# Patient Record
Sex: Female | Born: 1965 | Race: White | Hispanic: No | Marital: Single | Smoking: Never smoker
Health system: Southern US, Community
[De-identification: ages and names within clinical notes are randomized; demographics above are authoritative.]

## PROBLEM LIST (undated history)

## (undated) DIAGNOSIS — F329 Major depressive disorder, single episode, unspecified: Secondary | ICD-10-CM

## (undated) DIAGNOSIS — F32A Depression, unspecified: Secondary | ICD-10-CM

## (undated) DIAGNOSIS — K219 Gastro-esophageal reflux disease without esophagitis: Secondary | ICD-10-CM

## (undated) DIAGNOSIS — R7302 Impaired glucose tolerance (oral): Secondary | ICD-10-CM

## (undated) DIAGNOSIS — I10 Essential (primary) hypertension: Secondary | ICD-10-CM

## (undated) DIAGNOSIS — N189 Chronic kidney disease, unspecified: Secondary | ICD-10-CM

## (undated) HISTORY — DX: Chronic kidney disease, unspecified: N18.9

## (undated) HISTORY — DX: Essential (primary) hypertension: I10

## (undated) HISTORY — DX: Impaired glucose tolerance (oral): R73.02

## (undated) HISTORY — DX: Morbid (severe) obesity due to excess calories: E66.01

## (undated) HISTORY — DX: Gastro-esophageal reflux disease without esophagitis: K21.9

## (undated) HISTORY — DX: Depression, unspecified: F32.A

## (undated) HISTORY — DX: Major depressive disorder, single episode, unspecified: F32.9

---

## 1998-11-26 ENCOUNTER — Encounter: Payer: Self-pay | Admitting: Neurosurgery

## 1998-11-26 ENCOUNTER — Ambulatory Visit (HOSPITAL_COMMUNITY): Admission: RE | Admit: 1998-11-26 | Discharge: 1998-11-26 | Payer: Self-pay | Admitting: Neurosurgery

## 2000-07-11 ENCOUNTER — Other Ambulatory Visit: Admission: RE | Admit: 2000-07-11 | Discharge: 2000-07-11 | Payer: Self-pay | Admitting: Family Medicine

## 2003-01-10 ENCOUNTER — Other Ambulatory Visit: Admission: RE | Admit: 2003-01-10 | Discharge: 2003-01-10 | Payer: Self-pay | Admitting: Family Medicine

## 2005-07-25 ENCOUNTER — Ambulatory Visit: Payer: Self-pay | Admitting: Family Medicine

## 2005-09-24 ENCOUNTER — Ambulatory Visit: Payer: Self-pay | Admitting: Family Medicine

## 2006-12-09 HISTORY — PX: CHOLECYSTECTOMY: SHX55

## 2009-04-28 ENCOUNTER — Emergency Department (HOSPITAL_COMMUNITY): Admission: EM | Admit: 2009-04-28 | Discharge: 2009-04-28 | Payer: Self-pay | Admitting: Emergency Medicine

## 2009-12-09 HISTORY — PX: ABDOMINAL HYSTERECTOMY: SHX81

## 2010-06-05 ENCOUNTER — Other Ambulatory Visit: Admission: RE | Admit: 2010-06-05 | Discharge: 2010-06-05 | Payer: Self-pay | Admitting: Internal Medicine

## 2010-06-21 ENCOUNTER — Ambulatory Visit (HOSPITAL_COMMUNITY): Admission: RE | Admit: 2010-06-21 | Discharge: 2010-06-21 | Payer: Self-pay | Admitting: Internal Medicine

## 2010-08-01 ENCOUNTER — Ambulatory Visit: Payer: Self-pay | Admitting: Gynecology

## 2010-08-01 ENCOUNTER — Inpatient Hospital Stay (HOSPITAL_COMMUNITY): Admission: AD | Admit: 2010-08-01 | Discharge: 2010-08-01 | Payer: Self-pay | Admitting: Obstetrics & Gynecology

## 2010-08-30 ENCOUNTER — Other Ambulatory Visit: Admission: RE | Admit: 2010-08-30 | Discharge: 2010-08-30 | Payer: Self-pay | Admitting: Obstetrics & Gynecology

## 2010-08-30 ENCOUNTER — Ambulatory Visit: Payer: Self-pay | Admitting: Obstetrics & Gynecology

## 2010-09-13 ENCOUNTER — Ambulatory Visit: Payer: Self-pay | Admitting: Obstetrics and Gynecology

## 2010-09-25 ENCOUNTER — Ambulatory Visit (HOSPITAL_COMMUNITY): Admission: RE | Admit: 2010-09-25 | Discharge: 2010-09-25 | Payer: Self-pay | Admitting: Family Medicine

## 2010-09-27 ENCOUNTER — Ambulatory Visit: Payer: Self-pay | Admitting: Obstetrics & Gynecology

## 2010-09-28 ENCOUNTER — Ambulatory Visit: Payer: Self-pay | Admitting: Obstetrics & Gynecology

## 2010-10-31 IMAGING — US US TRANSVAGINAL NON-OB
1 series · 13 of 25 positions shown · non-contrast
Comparison: CT on 06/21/2010

CLINICAL DATA: Pelvic pain.  Abnormal uterine bleeding.  History of
uterine tumor.



[Series 1: us transvaginal non-ob · 0.19mm/px · 13 of 81 slices shown]
[im 1/81]
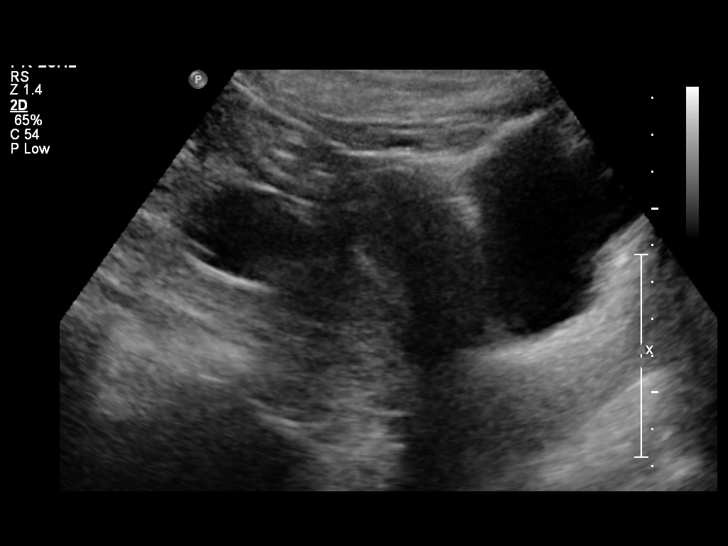
[im 7/81]
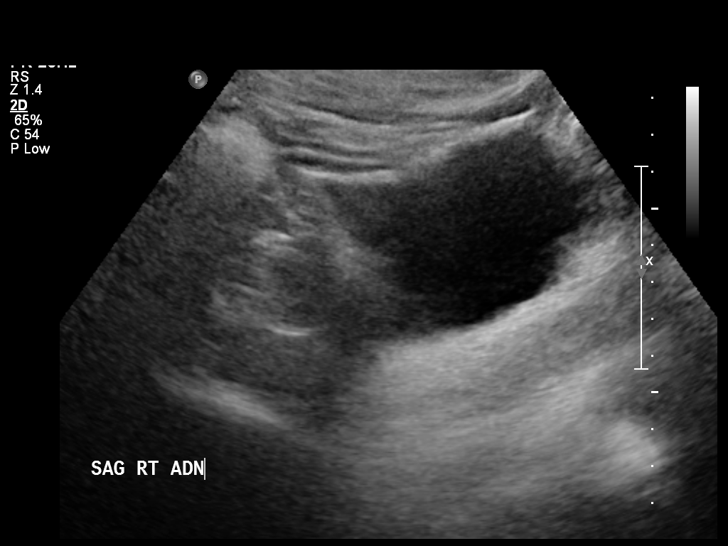
[im 14/81]
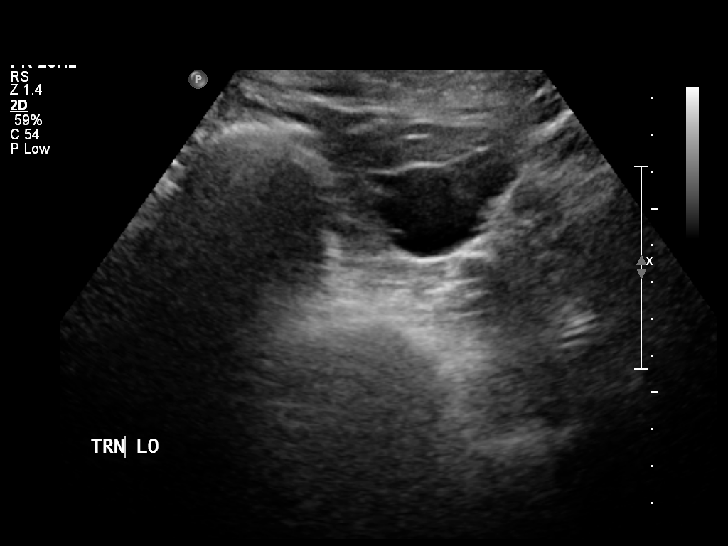
[im 21/81]
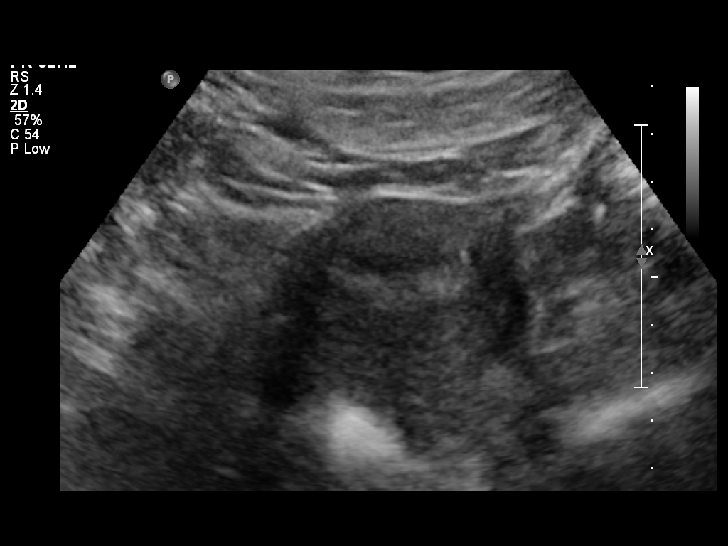
[im 27/81]
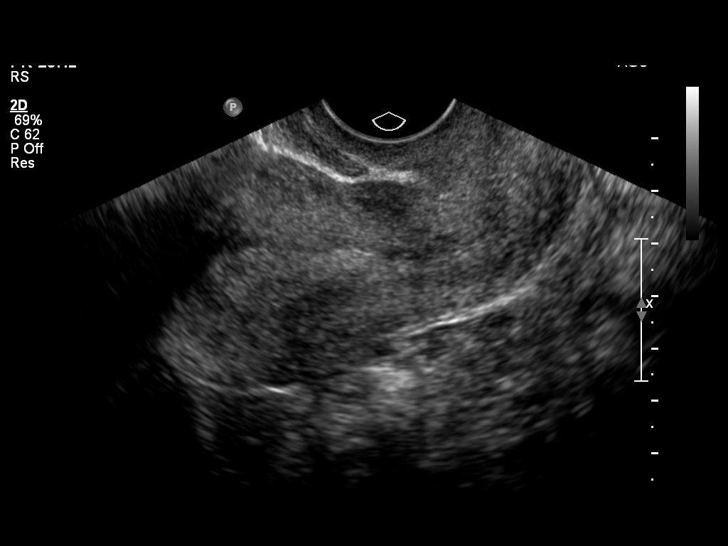
[im 34/81]
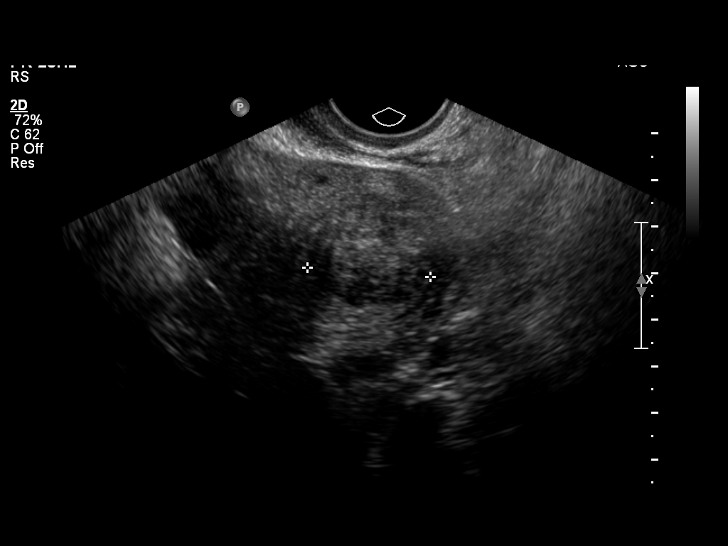
[im 41/81]
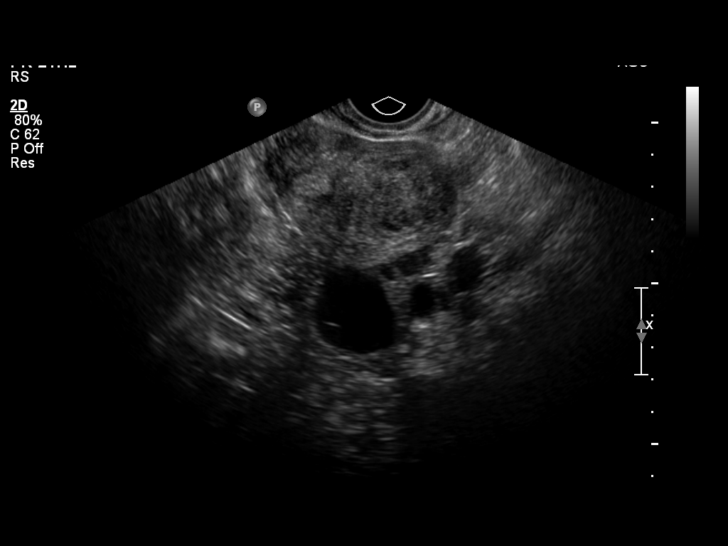
[im 47/81]
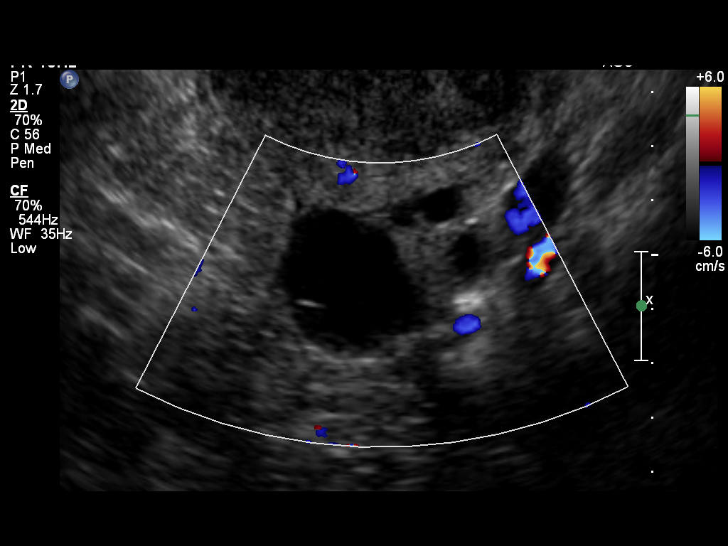
[im 54/81]
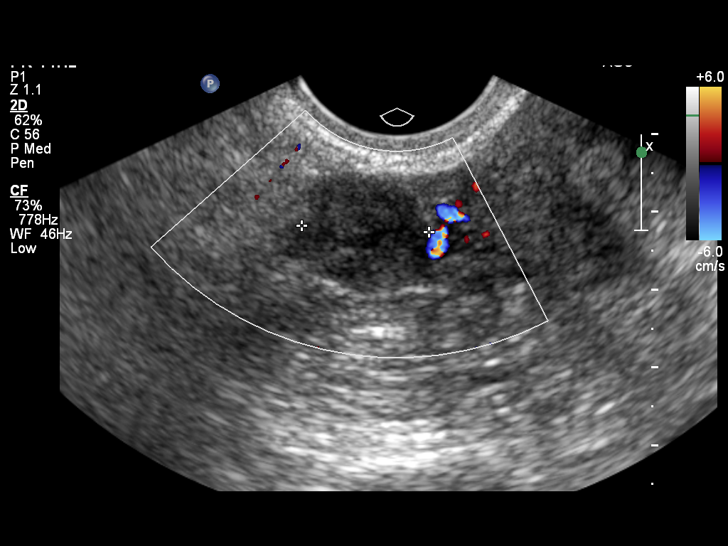
[im 61/81]
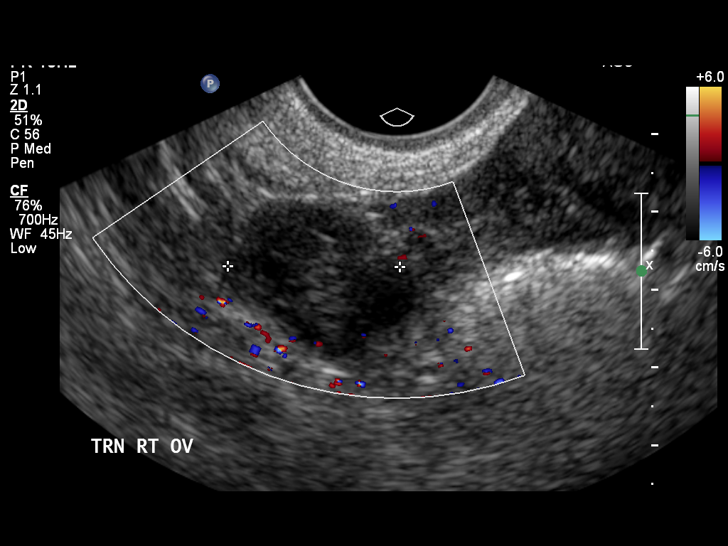
[im 67/81]
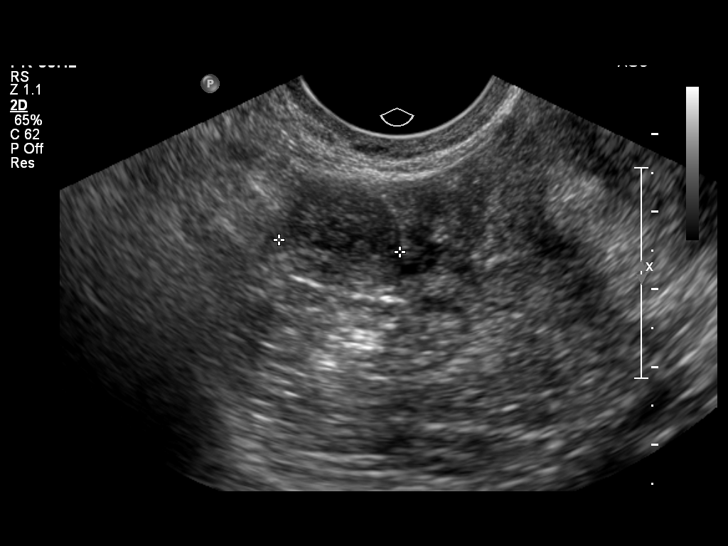
[im 74/81]
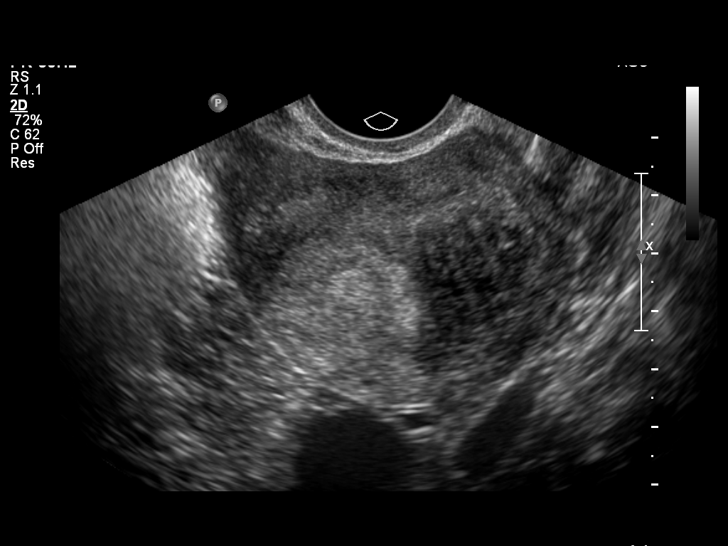
[im 81/81]
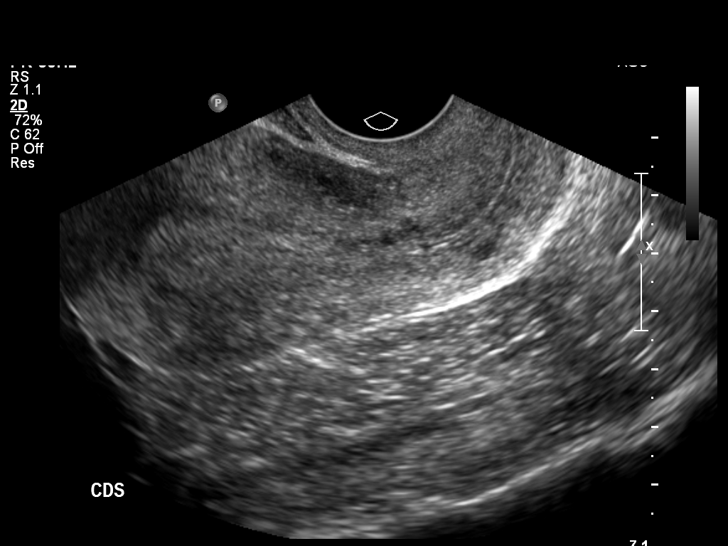

[13 of 25 positions shown; findings below may reference images not displayed]

FINDINGS: Uterus measures 9.3 x 4.9 x 5.4 cm. An intramural fibroid is seen
in the left uterine fundus measuring 2.9 cm in maximum diameter.  A
smaller fibroid is seen in the left anterior lower uterine body
which measures 1.5 cm in maximum diameter.

Endometrium measures 11 mm in thickness.  Within normal limits in
appearance.

Right Ovary measures 2.7 x 2.1 x 2.2 cm. A small mass is seen in
the right adnexa medial to the ovary which has a central cystic
area and measures 1.9 cm in maximum diameter.  Differential
diagnosis includes a small pedunculated fibroid, endometrioma, or
ectopic pregnancy.

Left Ovary measures 4.9 x 3.1 x 3.4 cm.  Normal appearance.

Other Findings:  No evidence of free fluid.
IMPRESSION: 1.  1.9 cm right adnexal mass which appears separate from the
ovary, and contains a central cystic area.  Differential diagnosis
includes pedunculated fibroid, endometrioma, and ectopic pregnancy.
Correlation with quantitative beta HCG level recommended.  Pelvic
MRI without and with contrast could be performed for further
evaluation if clinically warranted.
2.  Uterine fibroids, largest measuring 2.9 cm.

## 2010-11-12 ENCOUNTER — Inpatient Hospital Stay (HOSPITAL_COMMUNITY)
Admission: RE | Admit: 2010-11-12 | Discharge: 2010-11-13 | Payer: Self-pay | Source: Home / Self Care | Admitting: Obstetrics & Gynecology

## 2010-11-12 ENCOUNTER — Encounter: Payer: Self-pay | Admitting: Obstetrics & Gynecology

## 2010-11-26 ENCOUNTER — Inpatient Hospital Stay (HOSPITAL_COMMUNITY)
Admission: AD | Admit: 2010-11-26 | Discharge: 2010-11-26 | Payer: Self-pay | Source: Home / Self Care | Attending: Obstetrics & Gynecology | Admitting: Obstetrics & Gynecology

## 2010-11-29 ENCOUNTER — Ambulatory Visit: Payer: Self-pay | Admitting: Family Medicine

## 2010-12-12 ENCOUNTER — Ambulatory Visit: Admit: 2010-12-12 | Payer: Self-pay | Admitting: Obstetrics & Gynecology

## 2010-12-19 ENCOUNTER — Ambulatory Visit
Admission: RE | Admit: 2010-12-19 | Discharge: 2010-12-19 | Payer: Self-pay | Source: Home / Self Care | Attending: Obstetrics and Gynecology | Admitting: Obstetrics and Gynecology

## 2010-12-30 ENCOUNTER — Encounter: Payer: Self-pay | Admitting: Internal Medicine

## 2011-02-18 LAB — CBC
MCH: 28.4 pg (ref 26.0–34.0)
Platelets: 300 10*3/uL (ref 150–400)
RBC: 4.54 MIL/uL (ref 3.87–5.11)
RDW: 13.4 % (ref 11.5–15.5)
WBC: 10.6 10*3/uL — ABNORMAL HIGH (ref 4.0–10.5)

## 2011-02-18 LAB — URINE MICROSCOPIC-ADD ON

## 2011-02-18 LAB — WET PREP, GENITAL
Clue Cells Wet Prep HPF POC: NONE SEEN
Trich, Wet Prep: NONE SEEN
Yeast Wet Prep HPF POC: NONE SEEN

## 2011-02-18 LAB — URINALYSIS, ROUTINE W REFLEX MICROSCOPIC
Bilirubin Urine: NEGATIVE
Nitrite: NEGATIVE
Urobilinogen, UA: 0.2 mg/dL (ref 0.0–1.0)
pH: 6 (ref 5.0–8.0)

## 2011-02-18 LAB — URINE CULTURE

## 2011-02-18 LAB — DIFFERENTIAL
Basophils Absolute: 0 10*3/uL (ref 0.0–0.1)
Monocytes Relative: 6 % (ref 3–12)
Neutrophils Relative %: 68 % (ref 43–77)

## 2011-02-18 LAB — BASIC METABOLIC PANEL
BUN: 5 mg/dL — ABNORMAL LOW (ref 6–23)
GFR calc non Af Amer: >60 mL/min (ref >60)
Glucose, Bld: 108 mg/dL — ABNORMAL HIGH (ref 70–99)
Sodium: 140 mEq/L (ref 135–145)

## 2011-02-19 LAB — CBC
HCT: 31 % — ABNORMAL LOW (ref 36.0–46.0)
Hemoglobin: 10.8 g/dL — ABNORMAL LOW (ref 12.0–15.0)
MCHC: 33.5 g/dL (ref 30.0–36.0)
Platelets: 269 10*3/uL (ref 150–400)
RBC: 3.51 MIL/uL — ABNORMAL LOW (ref 3.87–5.11)
RBC: 4.23 MIL/uL (ref 3.87–5.11)
RDW: 13.8 % (ref 11.5–15.5)
WBC: 12.8 10*3/uL — ABNORMAL HIGH (ref 4.0–10.5)

## 2011-02-19 LAB — BASIC METABOLIC PANEL
CO2: 24 mEq/L (ref 19–32)
Calcium: 8.2 mg/dL — ABNORMAL LOW (ref 8.4–10.5)
Creatinine, Ser: 0.84 mg/dL (ref 0.4–1.2)
GFR calc non Af Amer: >60 mL/min (ref >60)
Glucose, Bld: 135 mg/dL — ABNORMAL HIGH (ref 70–99)
Sodium: 136 mEq/L (ref 135–145)

## 2011-02-19 LAB — TYPE AND SCREEN: ABO/RH(D): O POS

## 2011-02-19 LAB — SURGICAL PCR SCREEN: Staphylococcus aureus: NEGATIVE

## 2011-02-19 LAB — PREGNANCY, URINE: Preg Test, Ur: NEGATIVE

## 2011-02-22 LAB — CBC
MCH: 29.7 pg (ref 26.0–34.0)
MCV: 87.6 fL (ref 78.0–100.0)
RBC: 4.55 MIL/uL (ref 3.87–5.11)

## 2011-02-22 LAB — WET PREP, GENITAL
Clue Cells Wet Prep HPF POC: NONE SEEN
Yeast Wet Prep HPF POC: NONE SEEN

## 2011-02-22 LAB — GC/CHLAMYDIA PROBE AMP, GENITAL
Chlamydia, DNA Probe: NEGATIVE
GC Probe Amp, Genital: NEGATIVE

## 2011-05-14 ENCOUNTER — Other Ambulatory Visit: Payer: Self-pay | Admitting: Obstetrics & Gynecology

## 2011-05-14 DIAGNOSIS — Z1231 Encounter for screening mammogram for malignant neoplasm of breast: Secondary | ICD-10-CM

## 2011-05-22 ENCOUNTER — Ambulatory Visit (HOSPITAL_COMMUNITY): Payer: Self-pay

## 2011-06-13 ENCOUNTER — Ambulatory Visit
Admission: RE | Admit: 2011-06-13 | Discharge: 2011-06-13 | Disposition: A | Discharge status: Home / Self Care | Payer: Self-pay | Source: Ambulatory Visit | Attending: Internal Medicine | Admitting: Internal Medicine

## 2011-06-13 ENCOUNTER — Other Ambulatory Visit: Payer: Self-pay | Admitting: Internal Medicine

## 2011-06-13 DIAGNOSIS — N320 Bladder-neck obstruction: Secondary | ICD-10-CM

## 2011-06-17 ENCOUNTER — Ambulatory Visit (HOSPITAL_COMMUNITY): Payer: Self-pay | Attending: Obstetrics & Gynecology

## 2011-09-10 ENCOUNTER — Other Ambulatory Visit (HOSPITAL_COMMUNITY): Payer: Self-pay | Admitting: Internal Medicine

## 2011-09-10 DIAGNOSIS — R1900 Intra-abdominal and pelvic swelling, mass and lump, unspecified site: Secondary | ICD-10-CM

## 2011-09-12 ENCOUNTER — Encounter (HOSPITAL_COMMUNITY): Payer: Self-pay

## 2011-09-12 ENCOUNTER — Ambulatory Visit (HOSPITAL_COMMUNITY)
Admission: RE | Admit: 2011-09-12 | Discharge: 2011-09-12 | Disposition: A | Discharge status: Home / Self Care | Payer: Self-pay | Source: Ambulatory Visit | Attending: Internal Medicine | Admitting: Internal Medicine

## 2011-09-12 DIAGNOSIS — R319 Hematuria, unspecified: Secondary | ICD-10-CM | POA: Insufficient documentation

## 2011-09-12 DIAGNOSIS — N83209 Unspecified ovarian cyst, unspecified side: Secondary | ICD-10-CM | POA: Insufficient documentation

## 2011-09-12 DIAGNOSIS — K5900 Constipation, unspecified: Secondary | ICD-10-CM | POA: Insufficient documentation

## 2011-09-12 DIAGNOSIS — K7689 Other specified diseases of liver: Secondary | ICD-10-CM | POA: Insufficient documentation

## 2011-09-12 DIAGNOSIS — R1900 Intra-abdominal and pelvic swelling, mass and lump, unspecified site: Secondary | ICD-10-CM

## 2011-09-12 DIAGNOSIS — R112 Nausea with vomiting, unspecified: Secondary | ICD-10-CM | POA: Insufficient documentation

## 2011-09-12 DIAGNOSIS — K5730 Diverticulosis of large intestine without perforation or abscess without bleeding: Secondary | ICD-10-CM | POA: Insufficient documentation

## 2011-09-12 MED ORDER — IOHEXOL 300 MG/ML  SOLN
100.0000 mL | Freq: Once | INTRAMUSCULAR | Status: AC | PRN
  Administered 2011-09-12: 100 mL via INTRAVENOUS

## 2012-03-13 ENCOUNTER — Ambulatory Visit: Payer: Self-pay | Admitting: Obstetrics & Gynecology

## 2012-04-29 ENCOUNTER — Ambulatory Visit: Payer: Self-pay | Admitting: Obstetrics & Gynecology

## 2012-06-18 ENCOUNTER — Ambulatory Visit (INDEPENDENT_AMBULATORY_CARE_PROVIDER_SITE_OTHER): Payer: Self-pay | Admitting: Obstetrics & Gynecology

## 2012-06-18 ENCOUNTER — Encounter: Payer: Self-pay | Admitting: Obstetrics & Gynecology

## 2012-06-18 VITALS — BP 140/106 | HR 79 | Temp 99.6°F | Ht 67.0 in | Wt 254.9 lb

## 2012-06-18 DIAGNOSIS — R112 Nausea with vomiting, unspecified: Secondary | ICD-10-CM

## 2012-06-18 DIAGNOSIS — N83202 Unspecified ovarian cyst, left side: Secondary | ICD-10-CM | POA: Insufficient documentation

## 2012-06-18 DIAGNOSIS — N83209 Unspecified ovarian cyst, unspecified side: Secondary | ICD-10-CM

## 2012-06-18 MED ORDER — PROMETHAZINE HCL 25 MG PO TABS: 25.0000 mg | ORAL_TABLET | Freq: Four times a day (QID) | ORAL | Status: DC | PRN

## 2012-06-18 NOTE — Patient Instructions (Signed)
Return to clinic for any scheduled appointments or for any gynecologic concerns as needed.   

## 2012-06-18 NOTE — Progress Notes (Signed)
  Subjective:     Stefanie Lynch is a 46 y.o. female who presents for evaluation of abdominal pain. The pain is described as cramping, and is 7/10 in intensity. Pain is located in the LLQ and patient decribes as left ovarian pain area without radiation. Onset was intermittent starting 7-10 days ago. Symptoms have been unchanged since. Aggravating factors: none. Alleviating factors: aspirin. Associated symptoms: nausea and back pain. The patient denies fever, headache and vomiting. Patient had hysterectomy with removal of right ovary and tube in December 2011.  Menstrual History: OB History    Grav Para Term Preterm Abortions TAB SAB Ect Mult Living   0 0 0 0 0 0 0 0 0 0      No LMP recorded. Patient has had a hysterectomy.   The following portions of the patient's history were reviewed and updated as appropriate: allergies, current medications, past family history, past medical history, past social history, past surgical history and problem list.   Review of Systems Pertinent items are noted in HPI.    Objective:    BP 140/106  Pulse 79  Temp 99.6 F (37.6 C) (Oral)  Ht 5\' 7"  (1.702 m)  Wt 254 lb 14.4 oz (115.622 kg)  BMI 39.92 kg/m2 General:   alert, cooperative and no distress  Abdomen:  mild left lower quadrant pain  Extremities:   extremities normal, atraumatic, no cyanosis or edema  Neurologic:   negative  Psychiatric:   normal mood, behavior, speech, dress, and thought processes    Assessment:  47 yo F with pelvic pain  Plan:    The diagnosis was discussed with the patient and evaluation and treatment plans outlined. Will obtain pelvic ultrasound and manage results accordingly Patient asking for "hormone tests", this should be done by PCP Should continue to follow up with PCP for renal issues, depression and anxiety  Patient seen and examined. Agree with above.  [name], M.D. 06/18/2012 4:26 PM

## 2012-07-13 ENCOUNTER — Ambulatory Visit (HOSPITAL_COMMUNITY): Payer: Self-pay

## 2012-07-15 ENCOUNTER — Ambulatory Visit: Payer: Self-pay | Admitting: Advanced Practice Midwife

## 2012-07-23 ENCOUNTER — Ambulatory Visit (HOSPITAL_COMMUNITY): Payer: Self-pay

## 2012-08-03 ENCOUNTER — Ambulatory Visit: Payer: Self-pay | Admitting: Advanced Practice Midwife

## 2012-08-14 ENCOUNTER — Ambulatory Visit (HOSPITAL_COMMUNITY): Admission: RE | Admit: 2012-08-14 | Payer: Self-pay | Source: Ambulatory Visit

## 2012-08-20 ENCOUNTER — Ambulatory Visit: Payer: Self-pay | Admitting: Family Medicine

## 2012-08-21 ENCOUNTER — Ambulatory Visit (HOSPITAL_COMMUNITY)
Admission: RE | Admit: 2012-08-21 | Discharge: 2012-08-21 | Disposition: A | Discharge status: Home / Self Care | Payer: Medicaid Other | Source: Ambulatory Visit | Attending: Obstetrics & Gynecology | Admitting: Obstetrics & Gynecology

## 2012-08-21 DIAGNOSIS — N83209 Unspecified ovarian cyst, unspecified side: Secondary | ICD-10-CM | POA: Insufficient documentation

## 2012-08-21 DIAGNOSIS — N83202 Unspecified ovarian cyst, left side: Secondary | ICD-10-CM

## 2012-08-24 ENCOUNTER — Telehealth: Payer: Self-pay | Admitting: Medical

## 2012-08-24 NOTE — Telephone Encounter (Signed)
Message copied by Toula Moos on Mon Aug 24, 2012 12:04 PM ------      Message from: [name] A      Created: Mon Aug 24, 2012 10:37 AM       Normal ultrasound, normal left ovary. No anatomic reason seen for her pain. Please call to inform patient of results.

## 2012-08-24 NOTE — Telephone Encounter (Signed)
Patient called back and notified of normal ultrasound. She does not need to f/u result appt with Dr. Macon Large. Patient satisfied and agrees.

## 2012-08-24 NOTE — Telephone Encounter (Signed)
Patient called stating that she needs to see Dr. Macon Lynch before 08/27/12. She had [place] as recommended and needs documentation of what the follow-up is going to be to take to her primary care physician and before her disability hearing on 09/08/12.

## 2012-08-24 NOTE — Telephone Encounter (Signed)
Called patient and left message to call clinic back in regard of u/s results (note copied below)

## 2012-09-09 ENCOUNTER — Ambulatory Visit: Payer: Self-pay | Admitting: Obstetrics & Gynecology

## 2013-04-23 ENCOUNTER — Other Ambulatory Visit (HOSPITAL_COMMUNITY): Payer: Self-pay | Admitting: Internal Medicine

## 2013-04-26 ENCOUNTER — Other Ambulatory Visit (HOSPITAL_COMMUNITY): Payer: Medicaid Other

## 2013-04-27 ENCOUNTER — Ambulatory Visit (HOSPITAL_COMMUNITY)
Admission: RE | Admit: 2013-04-27 | Discharge: 2013-04-27 | Disposition: A | Discharge status: Home / Self Care | Payer: Medicaid Other | Source: Ambulatory Visit | Attending: Internal Medicine | Admitting: Internal Medicine

## 2013-04-27 ENCOUNTER — Other Ambulatory Visit (HOSPITAL_COMMUNITY): Payer: Self-pay | Admitting: Internal Medicine

## 2013-04-27 DIAGNOSIS — R079 Chest pain, unspecified: Secondary | ICD-10-CM | POA: Insufficient documentation

## 2013-04-28 ENCOUNTER — Telehealth: Payer: Self-pay | Admitting: *Deleted

## 2013-04-28 NOTE — Telephone Encounter (Signed)
Dr Johney Frame spoke with Dr Chilton Si.  His recommendations are to do a Myoview on patient this week for surgical clearance. Will have her seen by a MD first.  Will arrange an appointment with Dr Swaziland 04/29/13

## 2013-04-29 ENCOUNTER — Ambulatory Visit (INDEPENDENT_AMBULATORY_CARE_PROVIDER_SITE_OTHER): Payer: Medicaid Other | Admitting: Cardiology

## 2013-04-29 VITALS — BP 120/88 | HR 93 | Ht 67.0 in | Wt 287.4 lb

## 2013-04-29 DIAGNOSIS — R7302 Impaired glucose tolerance (oral): Secondary | ICD-10-CM

## 2013-04-29 DIAGNOSIS — R7309 Other abnormal glucose: Secondary | ICD-10-CM

## 2013-04-29 DIAGNOSIS — I10 Essential (primary) hypertension: Secondary | ICD-10-CM | POA: Insufficient documentation

## 2013-04-29 DIAGNOSIS — R079 Chest pain, unspecified: Secondary | ICD-10-CM | POA: Insufficient documentation

## 2013-04-29 NOTE — Progress Notes (Signed)
Stefanie Lynch Date of Birth: 01/27/1966 Medical Record #[id number]  History of Present Illness: Stefanie Lynch is seen at the request of Dr. Chilton Si for evaluation of chest pain. She is a pleasant 47 year old white female with history of glucose intolerance, hypertension, and morbid obesity. She reports that she has gained over 50 pounds since December of 2012. She is apparently listed for bariatric surgery at [facility]. Recently she was working in her garden when she developed acute substernal chest pain and shortness of breath. She initially developed shortness of breath and later had a sensation that someone was standing on her chest. She thought she might pass out and had to lie down. This lasted about for 5 minutes and then resolved. Several days later she developed similar chest discomfort after taking a shower. She was seen by Dr. Chilton Si who performed a routine stress test. She was able to walk for 3 minutes and 51 seconds but did not achieve her target heart rate. She did experience chest discomfort but had no significant ECG changes.  Current Outpatient Prescriptions on File Prior to Visit  Medication Sig Dispense Refill  . aspirin 81 MG tablet Take 81 mg by mouth as needed.      . clonazePAM (KLONOPIN) 0.5 MG tablet Take 1 mg by mouth 3 (three) times daily.       . DULoxetine (CYMBALTA) 30 MG capsule Take 30 mg by mouth daily.       No current facility-administered medications on file prior to visit.    Allergies  Allergen Reactions  . Vicodin (Hydrocodone-Acetaminophen)     Past Medical History  Diagnosis Date  . Depression   . Chronic kidney disease   . Glucose intolerance (impaired glucose tolerance)   . GERD (gastroesophageal reflux disease)   . Morbid obesity   . HTN (hypertension)     Past Surgical History  Procedure Laterality Date  . Cholecystectomy  2008  . Abdominal hysterectomy  2011    History  Smoking status  . Never Smoker   Smokeless  tobacco  . Never Used    History  Alcohol Use  . Yes    Comment: socially    Family History  Problem Relation Age of Onset  . Hypertension Mother   . Diabetes Mother   . Aneurysm Mother   . Cancer Mother   . Hypertension Father   . Cancer Father   . Cancer Maternal Uncle   . Cancer Paternal Uncle   . Stroke Paternal Uncle   . Heart disease Paternal Uncle   . Hypertension Maternal Grandmother     Review of Systems: The review of systems is positive for orthopnea. She sleeps on several pillows at night. She also has poor sleep pattern with mild snoring and she feels tired all the time.  All other systems were reviewed and are negative.  Physical Exam: BP 120/88  Pulse 93  Ht 5\' 7"  (1.702 m)  Wt 287 lb 6.4 oz (130.364 kg)  BMI 45 kg/m2  SpO2 94% She is a morbidly obese white female in no acute distress. HEENT: Normocephalic, atraumatic. Pupils are equal round and reactive. Sclera clear. Oropharynx is clear. Neck is short without significant JVD, adenopathy, or bruits. No thyromegaly. Lungs: Clear Cardiovascular: Regular rate and rhythm. Normal S1 and S2. No gallop, murmur, or click. Abdomen: Morbidly obese, soft, and nontender. No masses or bruits. Bowel sounds are positive. Extremities: No cyanosis or edema. Pedal pulses are 2+. Skin: Warm and dry  Neuro: Alert and oriented x3. Cranial nerves II through XII are intact.  LABORATORY DATA: Resting ECG is normal.  Assessment / Plan: 1. Exertional chest pain. Symptoms are concerning for angina. Routine stress test was nondiagnostic due to poor exercise tolerance and inability to achieve target heart rate. I recommended a Lexiscan Myoview study at this point. If abnormal would need to consider cardiac catheterization. If normal would aggressively treat risk factors and consider sleep apnea evaluation.  2. Morbid obesity. Patient has high-risk for obstructive sleep apnea. Would consider a sleep study once cardiac evaluation  complete.  3. Hypertension.  4. Glucose intolerance.

## 2013-04-29 NOTE — Patient Instructions (Signed)
Continue your current therapy  We will schedule you for a nuclear stress test   

## 2013-05-05 ENCOUNTER — Ambulatory Visit (HOSPITAL_COMMUNITY): Payer: Medicaid Other | Attending: Cardiology | Admitting: Radiology

## 2013-05-05 VITALS — BP 132/88 | Ht 67.0 in | Wt 290.0 lb

## 2013-05-05 DIAGNOSIS — R42 Dizziness and giddiness: Secondary | ICD-10-CM | POA: Insufficient documentation

## 2013-05-05 DIAGNOSIS — R7302 Impaired glucose tolerance (oral): Secondary | ICD-10-CM

## 2013-05-05 DIAGNOSIS — R5381 Other malaise: Secondary | ICD-10-CM | POA: Insufficient documentation

## 2013-05-05 DIAGNOSIS — E119 Type 2 diabetes mellitus without complications: Secondary | ICD-10-CM | POA: Insufficient documentation

## 2013-05-05 DIAGNOSIS — E669 Obesity, unspecified: Secondary | ICD-10-CM | POA: Insufficient documentation

## 2013-05-05 DIAGNOSIS — R0609 Other forms of dyspnea: Secondary | ICD-10-CM | POA: Insufficient documentation

## 2013-05-05 DIAGNOSIS — R0602 Shortness of breath: Secondary | ICD-10-CM | POA: Insufficient documentation

## 2013-05-05 DIAGNOSIS — Z8249 Family history of ischemic heart disease and other diseases of the circulatory system: Secondary | ICD-10-CM | POA: Insufficient documentation

## 2013-05-05 DIAGNOSIS — R110 Nausea: Secondary | ICD-10-CM | POA: Insufficient documentation

## 2013-05-05 DIAGNOSIS — R0989 Other specified symptoms and signs involving the circulatory and respiratory systems: Secondary | ICD-10-CM | POA: Insufficient documentation

## 2013-05-05 DIAGNOSIS — I10 Essential (primary) hypertension: Secondary | ICD-10-CM | POA: Insufficient documentation

## 2013-05-05 DIAGNOSIS — R079 Chest pain, unspecified: Secondary | ICD-10-CM

## 2013-05-05 DIAGNOSIS — R61 Generalized hyperhidrosis: Secondary | ICD-10-CM | POA: Insufficient documentation

## 2013-05-05 DIAGNOSIS — Z01810 Encounter for preprocedural cardiovascular examination: Secondary | ICD-10-CM | POA: Insufficient documentation

## 2013-05-05 DIAGNOSIS — R0789 Other chest pain: Secondary | ICD-10-CM | POA: Insufficient documentation

## 2013-05-05 MED ORDER — REGADENOSON 0.4 MG/5ML IV SOLN
0.4000 mg | Freq: Once | INTRAVENOUS | Status: AC
  Administered 2013-05-05: 0.4 mg via INTRAVENOUS

## 2013-05-05 MED ORDER — TECHNETIUM TC 99M SESTAMIBI GENERIC - CARDIOLITE
33.0000 | Freq: Once | INTRAVENOUS | Status: AC | PRN
  Administered 2013-05-05: 33 via INTRAVENOUS

## 2013-05-05 NOTE — Progress Notes (Signed)
[facility] SITE 3 NUCLEAR MED [address] [place], [place] [address] [phone]    Cardiology Nuclear Med Study  Stefanie Lynch is a 47 y.o. female     MRN : [id number]     DOB: July 14, 1966  Procedure Date: 05/05/2013  Nuclear Med Background Indication for Stress Test:  Evaluation for Ischemia, and Pending Surgical Clearance for  Bariatric Surgery  @ [facility] History:  No prior known history of CAD, 04-27-13 GXT: Unable to reach target heart rate  Cardiac Risk Factors: Family History - CAD, Hypertension, NIDDM and Obesity  Symptoms: Chest Pain and Chest Tightness with/without exertion (last occurrence this am),  Diaphoresis, DOE, Fatigue, Fatigue with Exertion, Light-Headedness, Nausea and SOB   Nuclear Pre-Procedure Caffeine/Decaff Intake:  None > 12 hrs  NPO After: 10:30pm   Lungs:  clear O2 Sat: 98% on room air. IV 0.9% NS with Angio Cath:  22g  IV Site: R Antecubital x 1, tolerated well IV Started by:  Irean Hong, RN  Chest Size (in):  38 Cup Size: C  Height: 5\' 7"  (1.702 m)  Weight:  290 lb (131.543 kg)  BMI:  Body mass index is 45.41 kg/(m^2). Tech Comments:  Held lopressor x 48 hrs    Nuclear Med Study 1 or 2 day study: 2 day  Stress Test Type:  Lexiscan  Reading MD: Kristeen Miss, MD  Order Authorizing Provider:  Peter Swaziland, MD  Resting Radionuclide: Technetium 92m Sestamibi  Resting Radionuclide Dose: 33.0 mCi  05/06/13  Stress Radionuclide:  Technetium 90m Sestamibi  Stress Radionuclide Dose: 33.0 mCi  05/05/13          Stress Protocol Rest HR: 93 Stress HR: 102  Rest BP: 132/88 Stress BP: 137/75  Exercise Time (min): n/a METS: n/a   Predicted Max HR: 175 bpm % Max HR: 58.29 bpm Rate Pressure Product: 86578   Dose of Adenosine (mg):  n/a Dose of Lexiscan: 0.4 mg  Dose of Atropine (mg): n/a Dose of Dobutamine: n/a mcg/kg/min (at max HR)  Stress Test Technologist: Irean Hong, RN  Nuclear Technologist:  Domenic Polite, CNMT     Rest  Procedure:  Myocardial perfusion imaging was performed at rest 45 minutes following the intravenous administration of Technetium 63m Sestamibi. Rest ECG: Normal sinus rhythm. Normal EKG.  Stress Procedure:  The patient received IV Lexiscan 0.4 mg over 15-seconds.  Technetium 32m Sestamibi injected at 30-seconds.  The patient denied chest pain with Lexiscan. Quantitative spect images were obtained after a 45 minute delay. Stress ECG: No significant change from baseline ECG  QPS Raw Data Images:  Normal; no motion artifact; normal heart/lung ratio. Stress Images:  Normal homogeneous uptake in all areas of the myocardium. Rest Images:  Normal homogeneous uptake in all areas of the myocardium. Subtraction (SDS):  No evidence of ischemia. Transient Ischemic Dilatation (Normal <1.22):  0.95 Lung/Heart Ratio (Normal <0.45):  0.30  Quantitative Gated Spect Images QGS EDV:  84 ml QGS ESV:  23 ml  Impression Exercise Capacity:  Lexiscan with no exercise. BP Response:  Normal blood pressure response. Clinical Symptoms:  head feels funny ECG Impression:  No significant ST segment change suggestive of ischemia. Comparison with Prior Nuclear Study: No previous nuclear study performed  Overall Impression:  Normal stress nuclear study. This is a low risk scan.  LV Ejection Fraction: 72%.  LV Wall Motion:  Normal Wall Motion  Willa Rough, MD

## 2013-05-06 ENCOUNTER — Ambulatory Visit (HOSPITAL_COMMUNITY): Payer: Medicaid Other | Attending: Cardiology

## 2013-05-06 DIAGNOSIS — R0989 Other specified symptoms and signs involving the circulatory and respiratory systems: Secondary | ICD-10-CM

## 2013-05-06 MED ORDER — TECHNETIUM TC 99M SESTAMIBI GENERIC - CARDIOLITE
30.0000 | Freq: Once | INTRAVENOUS | Status: AC | PRN
  Administered 2013-05-06: 30 via INTRAVENOUS

## 2014-02-07 ENCOUNTER — Other Ambulatory Visit: Payer: Self-pay

## 2014-02-07 DIAGNOSIS — Z1231 Encounter for screening mammogram for malignant neoplasm of breast: Secondary | ICD-10-CM

## 2014-02-17 ENCOUNTER — Ambulatory Visit: Payer: Medicaid Other

## 2014-02-24 ENCOUNTER — Ambulatory Visit
Admission: RE | Admit: 2014-02-24 | Discharge: 2014-02-24 | Disposition: A | Discharge status: Home / Self Care | Payer: Medicaid Other | Source: Ambulatory Visit

## 2014-02-24 DIAGNOSIS — Z1231 Encounter for screening mammogram for malignant neoplasm of breast: Secondary | ICD-10-CM

## 2014-04-06 ENCOUNTER — Other Ambulatory Visit (HOSPITAL_COMMUNITY): Payer: Self-pay | Admitting: Internal Medicine

## 2014-04-06 DIAGNOSIS — I509 Heart failure, unspecified: Secondary | ICD-10-CM

## 2014-04-08 ENCOUNTER — Other Ambulatory Visit: Payer: Self-pay | Admitting: Internal Medicine

## 2014-04-08 ENCOUNTER — Ambulatory Visit (HOSPITAL_COMMUNITY)
Admission: RE | Admit: 2014-04-08 | Discharge: 2014-04-08 | Disposition: A | Discharge status: Home / Self Care | Payer: Medicaid Other | Source: Ambulatory Visit | Attending: Internal Medicine | Admitting: Internal Medicine

## 2014-04-08 DIAGNOSIS — I517 Cardiomegaly: Secondary | ICD-10-CM

## 2014-04-08 DIAGNOSIS — I10 Essential (primary) hypertension: Secondary | ICD-10-CM | POA: Insufficient documentation

## 2014-04-08 DIAGNOSIS — I509 Heart failure, unspecified: Secondary | ICD-10-CM | POA: Insufficient documentation

## 2014-04-08 DIAGNOSIS — R0989 Other specified symptoms and signs involving the circulatory and respiratory systems: Secondary | ICD-10-CM | POA: Insufficient documentation

## 2014-04-08 DIAGNOSIS — R0609 Other forms of dyspnea: Secondary | ICD-10-CM | POA: Insufficient documentation

## 2014-04-08 DIAGNOSIS — R0789 Other chest pain: Secondary | ICD-10-CM | POA: Insufficient documentation

## 2014-04-08 DIAGNOSIS — R928 Other abnormal and inconclusive findings on diagnostic imaging of breast: Secondary | ICD-10-CM

## 2014-04-08 NOTE — Progress Notes (Signed)
*  PRELIMINARY RESULTS* Echocardiogram 2D Echocardiogram has been performed.  Stefanie Lynch 04/08/2014, 12:11 PM

## 2014-04-13 ENCOUNTER — Ambulatory Visit
Admission: RE | Admit: 2014-04-13 | Discharge: 2014-04-13 | Disposition: A | Discharge status: Home / Self Care | Payer: Medicaid Other | Source: Ambulatory Visit | Attending: Internal Medicine | Admitting: Internal Medicine

## 2014-04-13 DIAGNOSIS — R928 Other abnormal and inconclusive findings on diagnostic imaging of breast: Secondary | ICD-10-CM

## 2014-04-17 ENCOUNTER — Encounter (HOSPITAL_COMMUNITY): Payer: Self-pay | Admitting: Emergency Medicine

## 2014-04-17 ENCOUNTER — Emergency Department (HOSPITAL_COMMUNITY)
Admission: EM | Admit: 2014-04-17 | Discharge: 2014-04-17 | Disposition: A | Discharge status: Home / Self Care | Payer: Medicaid Other | Attending: Emergency Medicine | Admitting: Emergency Medicine

## 2014-04-17 DIAGNOSIS — R140 Abdominal distension (gaseous): Secondary | ICD-10-CM

## 2014-04-17 DIAGNOSIS — I129 Hypertensive chronic kidney disease with stage 1 through stage 4 chronic kidney disease, or unspecified chronic kidney disease: Secondary | ICD-10-CM | POA: Insufficient documentation

## 2014-04-17 DIAGNOSIS — Z8719 Personal history of other diseases of the digestive system: Secondary | ICD-10-CM | POA: Insufficient documentation

## 2014-04-17 DIAGNOSIS — M79605 Pain in left leg: Secondary | ICD-10-CM

## 2014-04-17 DIAGNOSIS — N189 Chronic kidney disease, unspecified: Secondary | ICD-10-CM | POA: Insufficient documentation

## 2014-04-17 DIAGNOSIS — Z79899 Other long term (current) drug therapy: Secondary | ICD-10-CM | POA: Insufficient documentation

## 2014-04-17 DIAGNOSIS — M79609 Pain in unspecified limb: Secondary | ICD-10-CM | POA: Insufficient documentation

## 2014-04-17 DIAGNOSIS — R143 Flatulence: Secondary | ICD-10-CM

## 2014-04-17 DIAGNOSIS — M7989 Other specified soft tissue disorders: Secondary | ICD-10-CM

## 2014-04-17 DIAGNOSIS — R142 Eructation: Secondary | ICD-10-CM

## 2014-04-17 DIAGNOSIS — E119 Type 2 diabetes mellitus without complications: Secondary | ICD-10-CM | POA: Insufficient documentation

## 2014-04-17 DIAGNOSIS — R141 Gas pain: Secondary | ICD-10-CM | POA: Insufficient documentation

## 2014-04-17 LAB — BASIC METABOLIC PANEL
BUN: 8 mg/dL (ref 6–23)
CHLORIDE: 101 meq/L (ref 96–112)
CO2: 26 mEq/L (ref 19–32)
CREATININE: 0.47 mg/dL — AB (ref 0.50–1.10)
Calcium: 8.6 mg/dL (ref 8.4–10.5)
Glucose, Bld: 109 mg/dL — ABNORMAL HIGH (ref 70–99)
POTASSIUM: 4.4 meq/L (ref 3.7–5.3)
Sodium: 142 mEq/L (ref 137–147)

## 2014-04-17 LAB — CBC
HEMATOCRIT: 37.3 % (ref 36.0–46.0)
HEMOGLOBIN: 12.1 g/dL (ref 12.0–15.0)
MCH: 28.7 pg (ref 26.0–34.0)
MCHC: 32.4 g/dL (ref 30.0–36.0)
MCV: 88.6 fL (ref 78.0–100.0)
Platelets: 223 10*3/uL (ref 150–400)
RBC: 4.21 MIL/uL (ref 3.87–5.11)
RDW: 14.6 % (ref 11.5–15.5)
WBC: 9.9 10*3/uL (ref 4.0–10.5)

## 2014-04-17 LAB — CBG MONITORING, ED: Glucose-Capillary: 108 mg/dL — ABNORMAL HIGH (ref 70–99)

## 2014-04-17 LAB — PROTIME-INR
INR: 0.9 (ref 0.00–1.49)
PROTHROMBIN TIME: 12 s (ref 11.6–15.2)

## 2014-04-17 MED ORDER — FUROSEMIDE 20 MG PO TABS
20.0000 mg | ORAL_TABLET | Freq: Once | ORAL | Status: AC
  Administered 2014-04-17: 20 mg via ORAL
  Filled 2014-04-17: qty 1

## 2014-04-17 NOTE — ED Provider Notes (Signed)
CSN: [id number]     Arrival date & time 04/17/14  1058 History   First MD Initiated Contact with Patient 04/17/14 1112     Chief Complaint  Patient presents with  . Leg Swelling     (Consider location/radiation/quality/duration/timing/severity/associated sxs/prior Treatment) HPI  Karena R Ether GriffinsFowler is a(n) 48 y.o. female who presents to the ED for evaluation or L LE swelling and pain. Patient is sent by her PCP. She has a pmh of diabetes, CKD, obesity and htn. Patient states that for the past 2 weeks she has had worsening pain and swelling in  The left LE. She states that it has worsened acutely over the past 3 days. She c/o of constant throbbing pain in the calf and swelling and stiffness behind the L knee. Pain is worsened with walking and movement. Occassionally, it radiates from the left SI joint and down the distribution of th sciatic nerve. This occurs frequently after working in the garden.  Patient also states that she had a 20 pound weight gain and hematuria last month. She had an echocardiogram and nephrology work up which were negative.  She cannot take lasix b/c of her kidney dysfunction. Denies fevers, chills, myalgias, arthralgias. Denies DOE, SOB, chest tightness or pressure, radiation to left arm, jaw or back, or diaphoresis. Denies dysuria, flank pain, suprapubic pain, frequency, or urgency. Denies headaches, light headedness, weakness, visual disturbances. Denies abdominal pain, nausea, vomiting, diarrhea or constipation.    Past Medical History  Diagnosis Date  . Depression   . Chronic kidney disease   . Glucose intolerance (impaired glucose tolerance)   . GERD (gastroesophageal reflux disease)   . Morbid obesity   . HTN (hypertension)    Past Surgical History  Procedure Laterality Date  . Cholecystectomy  2008  . Abdominal hysterectomy  2011   Family History  Problem Relation Age of Onset  . Hypertension Mother   . Diabetes Mother   . Aneurysm Mother   . Cancer  Mother   . Hypertension Father   . Cancer Father   . Cancer Maternal Uncle   . Cancer Paternal Uncle   . Stroke Paternal Uncle   . Heart disease Paternal Uncle   . Hypertension Maternal Grandmother    History  Substance Use Topics  . Smoking status: Never Smoker   . Smokeless tobacco: Never Used  . Alcohol Use: Yes     Comment: socially   OB History   Grav Para Term Preterm Abortions TAB SAB Ect Mult Living   0 0 0 0 0 0 0 0 0 0      Review of Systems  Ten systems reviewed and are negative for acute change, except as noted in the HPI.    Allergies  Vicodin  Home Medications   Prior to Admission medications   Medication Sig Start Date End Date Taking? Authorizing Provider  aspirin 81 MG tablet Take 81 mg by mouth as needed.    Historical Provider, MD  clonazePAM (KLONOPIN) 0.5 MG tablet Take 1 mg by mouth 3 (three) times daily.     Historical Provider, MD  DULoxetine (CYMBALTA) 30 MG capsule Take 30 mg by mouth daily.    Historical Provider, MD  hydrochlorothiazide (HYDRODIURIL) 25 MG tablet Take 25 mg by mouth daily.    Historical Provider, MD  metoprolol tartrate (LOPRESSOR) 25 MG tablet Take 25 mg by mouth daily.    Historical Provider, MD  nitroGLYCERIN (NITROSTAT) 0.4 MG SL tablet Place 0.4 mg under the tongue every  5 (five) minutes as needed for chest pain.    Historical Provider, MD   BP 127/79  Pulse 99  Temp(Src) 99.5 F (37.5 C) (Oral)  Resp 18  Wt 298 lb (135.172 kg)  SpO2 96% Physical Exam  Constitutional: She is oriented to person, place, and time. She appears well-developed and well-nourished. No distress.  Morbidly obese.  HENT:  Head: Normocephalic and atraumatic.  Eyes: Conjunctivae are normal. No scleral icterus.  Neck: Normal range of motion.  Cardiovascular: Normal rate, regular rhythm and normal heart sounds.  Exam reveals no gallop and no friction rub.   No murmur heard. BL LE edema. L leg > R. 1+ pitting edema No heat. + +calf  tenderness. Distal pulses intact.  Pulmonary/Chest: Effort normal and breath sounds normal. No respiratory distress.  Abdominal: Soft. Bowel sounds are normal. She exhibits no distension and no mass. There is no tenderness. There is no guarding.  Neurological: She is alert and oriented to person, place, and time.  Skin: Skin is warm and dry. She is not diaphoretic.    ED Course  Procedures (including critical care time) Labs Review Labs Reviewed  BASIC METABOLIC PANEL  CBC  PROTIME-INR  URINALYSIS, ROUTINE W REFLEX MICROSCOPIC  CBG MONITORING, ED    Imaging Review No results found.   EKG Interpretation None      MDM   Final diagnoses:  Left leg pain  Bloating symptom   11:54 AM BP 127/79  Pulse 99  Temp(Src) 99.5 F (37.5 C) (Oral)  Resp 18  Wt 298 lb (135.172 kg)  SpO2 96% Patient with multiple medical problems.  Basic labs, urine and DVT rule out. Patient offered pain medication but declines at this time.    1:39 PM Patient study negative for DVT. Her work up shows normal kidney function. Her echocardiogram last month showed an EF of 65-70 %.  She states that she has hematuria and thought that she probably had kidney problems because of that. I feel her pain is likely due to sciatica. As I cannot see any kidney dysfunction I will give the aptietn a dose of lasix here.  She may f/u with he pcp.  The patient appears reasonably screened and/or stabilized for discharge and I doubt any other medical condition or other [facility] requiring further screening, evaluation, or treatment in the ED at this time prior to discharge.   Arthor CaptainAbigail Skyelynn Rambeau, PA-C 04/19/14 [phone]

## 2014-04-17 NOTE — ED Notes (Signed)
Pt states L leg pain, radiating down entire leg to calf area. Ongoing for several weeks, states pain becomes worse with movement and weight bearing. 6/10 pain at the time. Pt is alert and oriented x4. NAD.

## 2014-04-17 NOTE — ED Notes (Signed)
Pt. Stated, Dr. Elmore GuiseEd Green sent me here cause my leg is swelling.Stefanie Lynch. Ive had an echo cardigram

## 2014-04-17 NOTE — Progress Notes (Signed)
*  Preliminary Results* Left lower extremity venous duplex completed. Left lower extremity is negative for deep vein thrombosis. There is no evidence of left Baker's cyst.  04/17/2014 1:25 PM  Gertie FeyMichelle Shakeda Pearse, RVT, RDCS, RDMS

## 2014-04-17 NOTE — Discharge Instructions (Signed)
Sciatica °Sciatica is pain, weakness, numbness, or tingling along the path of the sciatic nerve. The nerve starts in the lower back and runs down the back of each leg. The nerve controls the muscles in the lower leg and in the back of the knee, while also providing sensation to the back of the thigh, lower leg, and the sole of your foot. Sciatica is a symptom of another medical condition. For instance, nerve damage or certain conditions, such as a herniated disk or bone spur on the spine, pinch or put pressure on the sciatic nerve. This causes the pain, weakness, or other sensations normally associated with sciatica. Generally, sciatica only affects one side of the body. °CAUSES  °· Herniated or slipped disc. °· Degenerative disk disease. °· A pain disorder involving the narrow muscle in the buttocks (piriformis syndrome). °· Pelvic injury or fracture. °· Pregnancy. °· Tumor (rare). °SYMPTOMS  °Symptoms can vary from mild to very severe. The symptoms usually travel from the low back to the buttocks and down the back of the leg. Symptoms can include: °· Mild tingling or dull aches in the lower back, leg, or hip. °· Numbness in the back of the calf or sole of the foot. °· Burning sensations in the lower back, leg, or hip. °· Sharp pains in the lower back, leg, or hip. °· Leg weakness. °· Severe back pain inhibiting movement. °These symptoms may get worse with coughing, sneezing, laughing, or prolonged sitting or standing. Also, being overweight may worsen symptoms. °DIAGNOSIS  °Your caregiver will perform a physical exam to look for common symptoms of sciatica. He or she may ask you to do certain movements or activities that would trigger sciatic nerve pain. Other tests may be performed to find the cause of the sciatica. These may include: °· Blood tests. °· X-rays. °· Imaging tests, such as an MRI or CT scan. °TREATMENT  °Treatment is directed at the cause of the sciatic pain. Sometimes, treatment is not necessary  and the pain and discomfort goes away on its own. If treatment is needed, your caregiver may suggest: °· Over-the-counter medicines to relieve pain. °· Prescription medicines, such as anti-inflammatory medicine, muscle relaxants, or narcotics. °· Applying heat or ice to the painful area. °· Steroid injections to lessen pain, irritation, and inflammation around the nerve. °· Reducing activity during periods of pain. °· Exercising and stretching to strengthen your abdomen and improve flexibility of your spine. Your caregiver may suggest losing weight if the extra weight makes the back pain worse. °· Physical therapy. °· Surgery to eliminate what is pressing or pinching the nerve, such as a bone spur or part of a herniated disk. °HOME CARE INSTRUCTIONS  °· Only take over-the-counter or prescription medicines for pain or discomfort as directed by your caregiver. °· Apply ice to the affected area for 20 minutes, 3 4 times a day for the first 48 72 hours. Then try heat in the same way. °· Exercise, stretch, or perform your usual activities if these do not aggravate your pain. °· Attend physical therapy sessions as directed by your caregiver. °· Keep all follow-up appointments as directed by your caregiver. °· Do not wear high heels or shoes that do not provide proper support. °· Check your mattress to see if it is too soft. A firm mattress may lessen your pain and discomfort. °SEEK IMMEDIATE MEDICAL CARE IF:  °· You lose control of your bowel or bladder (incontinence). °· You have increasing weakness in the lower back,   pelvis, buttocks, or legs.  You have redness or swelling of your back.  You have a burning sensation when you urinate.  You have pain that gets worse when you lie down or awakens you at night.  Your pain is worse than you have experienced in the past.  Your pain is lasting longer than 4 weeks.  You are suddenly losing weight without reason. MAKE SURE YOU:  Understand these  instructions.  Will watch your condition.  Will get help right away if you are not doing well or get worse. Document Released: 11/19/2001 Document Revised: 05/26/2012 Document Reviewed: 04/05/2012 [facility] Patient Information 2014 [place], [place].  Bloating Bloating is the feeling of fullness in your belly. You may feel as though your pants are too tight. Often the cause of bloating is overeating, retaining fluids, or having gas in your bowel. It is also caused by swallowing air and eating foods that cause gas. Irritable bowel syndrome is one of the most common causes of bloating. Constipation is also a common cause. Sometimes more serious problems can cause bloating. SYMPTOMS  Usually there is a feeling of fullness, as though your abdomen is bulged out. There may be mild discomfort.  DIAGNOSIS  Usually no particular testing is necessary for most bloating. If the condition persists and seems to become worse, your caregiver may do additional testing.  TREATMENT   There is no direct treatment for bloating.  Do not put gas into the bowel. Avoid chewing gum and sucking on candy. These tend to make you swallow air. Swallowing air can also be a nervous habit. Try to avoid this.  Avoiding high residue diets will help. Eat foods with soluble fibers (examples include root vegetables, apples, or barley) and substitute dairy products with soy and rice products. This helps irritable bowel syndrome.  If constipation is the cause, then a high residue diet with more fiber will help.  Avoid carbonated beverages.  Over-the-counter preparations are available that help reduce gas. Your pharmacist can help you with this. SEEK MEDICAL CARE IF:   Bloating continues and seems to be getting worse.  You notice a weight gain.  You have a weight loss but the bloating is getting worse.  You have changes in your bowel habits or develop nausea or vomiting. SEEK IMMEDIATE MEDICAL CARE IF:   You develop  shortness of breath or swelling in your legs.  You have an increase in abdominal pain or develop chest pain. Document Released: 09/25/2006 Document Revised: 02/17/2012 Document Reviewed: 11/13/2007 [facility] Patient Information 2014 [place], [place].

## 2014-04-20 ENCOUNTER — Other Ambulatory Visit: Payer: Medicaid Other

## 2014-04-26 NOTE — ED Provider Notes (Signed)
Medical screening examination/treatment/procedure(s) were performed by non-physician practitioner and as supervising physician I was immediately available for consultation/collaboration.   EKG Interpretation None      Devoria AlbeIva Eligh Rybacki, MD, Armando GangFACEP   Ward GivensIva L Maymunah Stegemann, MD 04/26/14 [phone]

## 2014-06-20 ENCOUNTER — Other Ambulatory Visit: Payer: Self-pay | Admitting: Internal Medicine

## 2014-06-20 DIAGNOSIS — M5430 Sciatica, unspecified side: Secondary | ICD-10-CM

## 2014-06-26 ENCOUNTER — Ambulatory Visit
Admission: RE | Admit: 2014-06-26 | Discharge: 2014-06-26 | Disposition: A | Discharge status: Home / Self Care | Payer: Medicaid Other | Source: Ambulatory Visit | Attending: Internal Medicine | Admitting: Internal Medicine

## 2014-06-26 DIAGNOSIS — M5430 Sciatica, unspecified side: Secondary | ICD-10-CM

## 2014-09-19 ENCOUNTER — Other Ambulatory Visit: Payer: Self-pay | Admitting: Internal Medicine

## 2014-09-19 DIAGNOSIS — N6489 Other specified disorders of breast: Secondary | ICD-10-CM

## 2014-10-14 ENCOUNTER — Ambulatory Visit
Admission: RE | Admit: 2014-10-14 | Discharge: 2014-10-14 | Disposition: A | Discharge status: Home / Self Care | Payer: Medicaid Other | Source: Ambulatory Visit | Attending: Internal Medicine | Admitting: Internal Medicine

## 2014-10-14 DIAGNOSIS — N6489 Other specified disorders of breast: Secondary | ICD-10-CM

## 2015-04-11 ENCOUNTER — Other Ambulatory Visit: Payer: Self-pay

## 2015-04-11 DIAGNOSIS — Z1231 Encounter for screening mammogram for malignant neoplasm of breast: Secondary | ICD-10-CM

## 2015-04-25 ENCOUNTER — Ambulatory Visit
Admission: RE | Admit: 2015-04-25 | Discharge: 2015-04-25 | Disposition: A | Discharge status: Home / Self Care | Payer: Medicaid Other | Source: Ambulatory Visit

## 2015-04-25 ENCOUNTER — Ambulatory Visit: Payer: Medicaid Other

## 2015-04-25 DIAGNOSIS — Z1231 Encounter for screening mammogram for malignant neoplasm of breast: Secondary | ICD-10-CM

## 2015-06-21 ENCOUNTER — Ambulatory Visit
Admission: RE | Admit: 2015-06-21 | Discharge: 2015-06-21 | Disposition: A | Discharge status: Home / Self Care | Payer: Medicaid Other | Source: Ambulatory Visit | Attending: Internal Medicine | Admitting: Internal Medicine

## 2015-06-21 ENCOUNTER — Other Ambulatory Visit: Payer: Self-pay | Admitting: Internal Medicine

## 2015-06-21 DIAGNOSIS — M549 Dorsalgia, unspecified: Secondary | ICD-10-CM

## 2017-05-20 ENCOUNTER — Ambulatory Visit
Admission: RE | Admit: 2017-05-20 | Discharge: 2017-05-20 | Disposition: A | Discharge status: Home / Self Care | Payer: Medicaid Other | Source: Ambulatory Visit | Attending: Internal Medicine | Admitting: Internal Medicine

## 2017-05-20 ENCOUNTER — Other Ambulatory Visit: Payer: Self-pay | Admitting: Internal Medicine

## 2017-05-20 DIAGNOSIS — R1031 Right lower quadrant pain: Secondary | ICD-10-CM

## 2017-05-20 DIAGNOSIS — R109 Unspecified abdominal pain: Secondary | ICD-10-CM

## 2017-05-20 DIAGNOSIS — R319 Hematuria, unspecified: Secondary | ICD-10-CM

## 2018-02-04 ENCOUNTER — Ambulatory Visit
Admission: RE | Admit: 2018-02-04 | Discharge: 2018-02-04 | Disposition: A | Discharge status: Home / Self Care | Payer: Medicaid Other | Source: Ambulatory Visit | Attending: Internal Medicine | Admitting: Internal Medicine

## 2018-02-04 ENCOUNTER — Other Ambulatory Visit: Payer: Self-pay | Admitting: Internal Medicine

## 2018-02-04 DIAGNOSIS — M25511 Pain in right shoulder: Secondary | ICD-10-CM

## 2019-07-28 ENCOUNTER — Other Ambulatory Visit: Payer: Self-pay | Admitting: Internal Medicine

## 2019-07-28 DIAGNOSIS — Z1231 Encounter for screening mammogram for malignant neoplasm of breast: Secondary | ICD-10-CM

## 2019-09-13 ENCOUNTER — Ambulatory Visit: Payer: Medicaid Other

## 2019-10-28 ENCOUNTER — Ambulatory Visit: Payer: Medicaid Other
